# Patient Record
Sex: Female | Born: 1985 | Race: White | Hispanic: No | Marital: Married | Smoking: Former smoker
Health system: Southern US, Community
[De-identification: ages and names within clinical notes are randomized; demographics above are authoritative.]

## PROBLEM LIST (undated history)

## (undated) DIAGNOSIS — T790XXA Air embolism (traumatic), initial encounter: Secondary | ICD-10-CM

---

## 2010-08-25 DIAGNOSIS — T790XXA Air embolism (traumatic), initial encounter: Secondary | ICD-10-CM

## 2010-08-25 HISTORY — DX: Air embolism (traumatic), initial encounter: T79.0XXA

## 2016-08-21 ENCOUNTER — Emergency Department: Payer: Medicaid Other

## 2016-08-21 ENCOUNTER — Emergency Department
Admission: EM | Admit: 2016-08-21 | Discharge: 2016-08-21 | Disposition: A | Discharge status: Home / Self Care | Payer: Medicaid Other | Source: Home / Self Care | Attending: Emergency Medicine | Admitting: Emergency Medicine

## 2016-08-21 ENCOUNTER — Encounter: Payer: Self-pay | Admitting: Emergency Medicine

## 2016-08-21 ENCOUNTER — Observation Stay
Admit: 2016-08-21 | Discharge: 2016-08-21 | Disposition: A | Discharge status: Home / Self Care | Payer: Medicaid Other | Attending: Obstetrics and Gynecology | Admitting: Obstetrics and Gynecology

## 2016-08-21 DIAGNOSIS — R109 Unspecified abdominal pain: Secondary | ICD-10-CM | POA: Insufficient documentation

## 2016-08-21 DIAGNOSIS — O26893 Other specified pregnancy related conditions, third trimester: Secondary | ICD-10-CM

## 2016-08-21 DIAGNOSIS — S52125A Nondisplaced fracture of head of left radius, initial encounter for closed fracture: Secondary | ICD-10-CM

## 2016-08-21 DIAGNOSIS — Y939 Activity, unspecified: Secondary | ICD-10-CM

## 2016-08-21 DIAGNOSIS — Y999 Unspecified external cause status: Secondary | ICD-10-CM | POA: Insufficient documentation

## 2016-08-21 DIAGNOSIS — Z87891 Personal history of nicotine dependence: Secondary | ICD-10-CM

## 2016-08-21 DIAGNOSIS — O26899 Other specified pregnancy related conditions, unspecified trimester: Secondary | ICD-10-CM

## 2016-08-21 DIAGNOSIS — S42409A Unspecified fracture of lower end of unspecified humerus, initial encounter for closed fracture: Secondary | ICD-10-CM | POA: Diagnosis present

## 2016-08-21 DIAGNOSIS — S52122A Displaced fracture of head of left radius, initial encounter for closed fracture: Secondary | ICD-10-CM

## 2016-08-21 DIAGNOSIS — Z3A32 32 weeks gestation of pregnancy: Secondary | ICD-10-CM

## 2016-08-21 DIAGNOSIS — Y929 Unspecified place or not applicable: Secondary | ICD-10-CM | POA: Insufficient documentation

## 2016-08-21 DIAGNOSIS — Z3490 Encounter for supervision of normal pregnancy, unspecified, unspecified trimester: Secondary | ICD-10-CM

## 2016-08-21 DIAGNOSIS — R1031 Right lower quadrant pain: Secondary | ICD-10-CM

## 2016-08-21 DIAGNOSIS — W19XXXA Unspecified fall, initial encounter: Secondary | ICD-10-CM | POA: Diagnosis not present

## 2016-08-21 DIAGNOSIS — W1830XA Fall on same level, unspecified, initial encounter: Secondary | ICD-10-CM | POA: Diagnosis present

## 2016-08-21 DIAGNOSIS — O9989 Other specified diseases and conditions complicating pregnancy, childbirth and the puerperium: Secondary | ICD-10-CM

## 2016-08-21 DIAGNOSIS — W010XXA Fall on same level from slipping, tripping and stumbling without subsequent striking against object, initial encounter: Secondary | ICD-10-CM

## 2016-08-21 HISTORY — DX: Air embolism (traumatic), initial encounter: T79.0XXA

## 2016-08-21 MED ORDER — HYDROCODONE-ACETAMINOPHEN 5-325 MG PO TABS
1.0000 | ORAL_TABLET | Freq: Once | ORAL | Status: AC | PRN
  Administered 2016-08-21: 1 via ORAL
  Filled 2016-08-21: qty 1

## 2016-08-21 NOTE — ED Notes (Signed)
Patient c/o cramping in right back

## 2016-08-21 NOTE — ED Triage Notes (Signed)
Pt tripped and fell and is co left elbow pain. Is also co right sided abd cramping pt is [redacted] weeks pregnant, but does have fetal movement. Pt denies any injuries at this time.

## 2016-08-21 NOTE — ED Notes (Signed)
Reviewed d/c instructions, follow-up care, use of ice/elevation, and splint care with patient. Pt verbalized understanding

## 2016-08-21 NOTE — Discharge Planning (Signed)
Patient given discharge instructions with education on fetal kick counts and back and abdominal pain during pregnancy.  Education provided about returning to hospital with contractions, leaking of fluid, and decreased fetal movement.  Patient [facility] [place]  Patient verbalized understanding.

## 2016-08-21 NOTE — ED Provider Notes (Signed)
[facility] Emergency Department Provider Note  ____________________________________________  Time seen: Approximately 9:06 PM  I have reviewed the triage vital signs and the nursing notes.   HISTORY  Chief Complaint Fall    HPI Michelle Mckay is a 30 y.o. female who presents to emergency department complaining of left elbow pain and right abdominal pain. Patient states that she is32 weeks pregnant and had a mechanical fall. Patient states that she caught herself with her left elbow but did hit right side of her abdomen. Patient reports sharp right abdominal pain is still has fetal movement. Patient denies any ecchymosis to the abdominal wall. Patient states that she caught most of her weight with her left elbow. She is having sharp pain over the olecranon with radiation to the forearm and mid upper arm. Patient denies any wrist or shoulder pain. She did not hit her head or lose consciousness.  Patient is evaluated in the emergency department for her left elbow pain and will be sent upstairs to labor and delivery for assessment of abdominal pain/trauma in pregnancy.   No past medical history on file.  There are no active problems to display for this patient.   No past surgical history on file.  Prior to Admission medications   Not on File    Allergies Patient has no known allergies.  No family history on file.  Social History Social History  Substance Use Topics  . Smoking status: Not on file  . Smokeless tobacco: Not on file  . Alcohol use Not on file     Review of Systems  Constitutional: No fever/chills Eyes: No visual changes. Cardiovascular: no chest pain. Respiratory: no cough. No SOB. Gastrointestinal: Positive for right abdominal pain. Patient reports good fetal movement at this time.  No nausea, no vomiting.  No diarrhea.  No constipation. Genitourinary: No vaginal bleeding or discharge Musculoskeletal: Positive for sharp left  elbow pain Skin: Negative for rash, abrasions, lacerations, ecchymosis. Neurological: Negative for headaches, focal weakness or numbness. 10-point ROS otherwise negative.  ____________________________________________   PHYSICAL EXAM:  VITAL SIGNS: ED Triage Vitals  Enc Vitals Group     BP 08/21/16 1954 (!) 141/85     Pulse Rate 08/21/16 1954 97     Resp 08/21/16 1954 18     Temp 08/21/16 1954 98.2 F (36.8 C)     Temp Source 08/21/16 1954 Oral     SpO2 08/21/16 1954 99 %     Weight 08/21/16 1954 232 lb (105.2 kg)     Height 08/21/16 1954 5\' 3"  (1.6 m)     Head Circumference --      Peak Flow --      Pain Score 08/21/16 1955 5     Pain Loc --      Pain Edu? --      Excl. in GC? --      Constitutional: Alert and oriented. Well appearing and in no acute distress. Eyes: Conjunctivae are normal. PERRL. EOMI. Head: Atraumatic. Neck: No stridor.    Cardiovascular: Normal rate, regular rhythm. Normal S1 and S2.  Good peripheral circulation. Respiratory: Normal respiratory effort without tachypnea or retractions. Lungs CTAB. Good air entry to the bases with no decreased or absent breath sounds. Gastrointestinal: No visible ecchymosis, abrasions, lacerations noted to the abdominal wall. Bowel sounds 4 quadrants. Gravid abdomen. Soft to palpation. Patient is tender in the right lower quadrant.. No guarding or rigidity. No palpable masses. No distention Musculoskeletal: Limited range of motion to the  left elbow due to pain. Patient is exquisitely tender to palpation over the olecranon process and medial epicondyle. No palpable abnormality. Examination of the shoulder and wrist are unremarkable. Radial pulse intact distally. Sensation intact 5 digits. Neurologic:  Normal speech and language. No gross focal neurologic deficits are appreciated.  Skin:  Skin is warm, dry and intact. No rash noted. Psychiatric: Mood and affect are normal. Speech and behavior are normal. Patient exhibits  appropriate insight and judgement.   ____________________________________________   LABS (all labs ordered are listed, but only abnormal results are displayed)  Labs Reviewed - No data to display ____________________________________________  EKG   ____________________________________________  RADIOLOGY Festus BarrenI, Jonathan D Cuthriell, personally viewed and evaluated these images (plain radiographs) as part of my medical decision making, as well as reviewing the written report by the radiologist.  Dg Elbow Complete Left  Result Date: 08/21/2016 CLINICAL DATA:  Fall today onto left elbow all, not with elbow pain and limited range of motion. Patient in third trimester pregnancy. EXAM: LEFT ELBOW - COMPLETE 3+ VIEW COMPARISON:  None. FINDINGS: Minimally displaced fracture of the radial head extending to the radiocapitellar joint. Articular surface distraction of at least 3 mm with minimal depression. There is an associated joint effusion. No associated dislocation. Distal humerus and proximal ulna are intact. IMPRESSION: Minimally displaced intra-articular radial head fracture with associated joint effusion. Electronically Signed   By: Rubye OaksMelanie  Ehinger M.D.   On: 08/21/2016 21:11    ____________________________________________    PROCEDURES  Procedure(s) performed:    .Splint Application Date/Time: 08/21/2016 11:43 PM Performed by: Gala RomneyUTHRIELL, JONATHAN D Authorized by: Gala RomneyUTHRIELL, JONATHAN D   Consent:    Consent obtained:  Verbal   Consent given by:  Patient   Risks discussed:  Pain Pre-procedure details:    Sensation:  Normal Procedure details:    Laterality:  Left   Location:  Elbow   Elbow:  L elbow   Splint type:  Long arm   Supplies:  Cotton padding, Ortho-Glass, elastic bandage and sling Post-procedure details:    Pain:  Improved   Sensation:  Normal   Patient tolerance of procedure:  Tolerated well, no immediate complications Comments:     Posterior OCL long arm  splint is applied. Patient is given sling for support.      Medications - No data to display   ____________________________________________   INITIAL IMPRESSION / ASSESSMENT AND PLAN / ED COURSE  Pertinent labs & imaging results that were available during my care of the patient were reviewed by me and considered in my medical decision making (see chart for details).  Review of the [place] CSRS was performed in accordance of the [facility] prior to dispensing any controlled drugs.  Clinical Course     Patient's diagnosis is consistent with Left radial head fracture.. Patient is [redacted] weeks pregnant. Discussed risks versus benefits of x-ray for injury. This time, patient will be shielded with lead apron and x-rays of the left elbow will be undertaken. This reveals the above diagnoses. Elbow was splinted and placed in a sling.. Vision is pregnant so she may take Tylenol for pain at home. At this time, no investigation into abdominal pain is undertaken as patient will go up to labor and delivery and be evaluated by OB/GYN.  Upon discharge, patient will be transported upstairs to labor and delivery unit for evaluation of abdominal trauma in pregnancy. At this time, no imaging, labs are undertaken as this will be managed by LDR.    ____________________________________________  FINAL CLINICAL IMPRESSION(S) / ED DIAGNOSES  Final diagnoses:  Closed nondisplaced fracture of head of left radius, initial encounter  Fall, initial encounter  [redacted] weeks gestation of pregnancy  Abdominal pain during intrauterine pregnancy      NEW MEDICATIONS STARTED DURING THIS VISIT:  There are no discharge medications for this patient.       This chart was dictated using voice recognition software/Dragon. Despite best efforts to proofread, errors can occur which can change the meaning. Any change was purely unintentional.    Racheal Patches, PA-C 08/21/16 [id number]    Delorise Royals Cuthriell, PA-C 08/21/16  [id number]    Charlynne Pander, MD 08/22/16 [phone]

## 2016-08-21 NOTE — ED Notes (Signed)
Patient reports she feels fetal movements.

## 2016-08-21 NOTE — OB Triage Note (Signed)
Patient arrived in triage from ED after being cleared medically. Patient arrived to ED for fall at approx 1915. Patient fell on left arm but hit right side of abdomen. Diagnosed with a broken left elbow in ED. States she is having "sharp/shooting" pain on right side of abdomen and lower back when breathing in. States it does not feel like contractions. Positive fetal movement noted. Denies leaking of fluid or vaginal bleeding. EFM applied. Discussed plan of care. Patient verbalized understanding.

## 2016-08-21 NOTE — ED Notes (Signed)
Patient reports she fell at approx 1915 today on left elbow and right abdomen. Patient is [redacted] weeks pregnant.

## 2016-08-23 DIAGNOSIS — S42409A Unspecified fracture of lower end of unspecified humerus, initial encounter for closed fracture: Secondary | ICD-10-CM | POA: Diagnosis present

## 2016-08-23 DIAGNOSIS — O26893 Other specified pregnancy related conditions, third trimester: Secondary | ICD-10-CM | POA: Diagnosis present

## 2016-08-23 DIAGNOSIS — W1830XA Fall on same level, unspecified, initial encounter: Secondary | ICD-10-CM | POA: Diagnosis present

## 2016-08-23 DIAGNOSIS — R109 Unspecified abdominal pain: Secondary | ICD-10-CM

## 2016-08-23 NOTE — Final Progress Note (Signed)
L&D OB Triage Note  Michelle Mckay is a 30 y.o. 848P7 unassigned female at 7010w3d, EDD Estimated Date of Delivery: 10/13/16 who presented to triage after being evaluated in Emergency Room for s/p fall (ground level).  Patient fell on left arm but hit right side of the abdomen.  Was diagnosed with a broken left elbow in the ED.  Is curently noting sharp/shooting pain on right side of abdomen and lower ack when breathing in.  Denies contractions, LOF, or vaginal bleeding. She was evaluated by the nurses with no significant findings for contractions or evidence of abruption. Vital signs stable. An NST was performed and has been reviewed by MD. She was treated with Percocet for pain.   NST INTERPRETATION: Indications: rule out uterine contractions and s/p fall  Mode: External (EFM d/c'd for patient discharge home) Baseline Rate (A): 145 bpm Variability: Moderate Accelerations: 15 x 15 Decelerations: None     Contraction Frequency (min): 0  Impression: reactive   Plan: NST performed was reviewed and was found to be reactive. She was discharged home with bleeding/ preterm labor precautions.  Continue routine prenatal care. Follow up with OB/GYN as previously scheduled.     Hildred LaserAnika Kauri Garson, MD  [facility]

## 2017-06-26 ENCOUNTER — Encounter (HOSPITAL_COMMUNITY): Payer: Self-pay

## 2018-03-11 IMAGING — DX DG ELBOW COMPLETE 3+V*L*
4 series · 4 of 4 positions shown · non-contrast
Comparison: None.

CLINICAL DATA: Fall today onto left elbow all, not with elbow pain
and limited range of motion. Patient in third trimester pregnancy.

EXAM:
LEFT ELBOW - COMPLETE 3+ VIEW

[elbow ap]
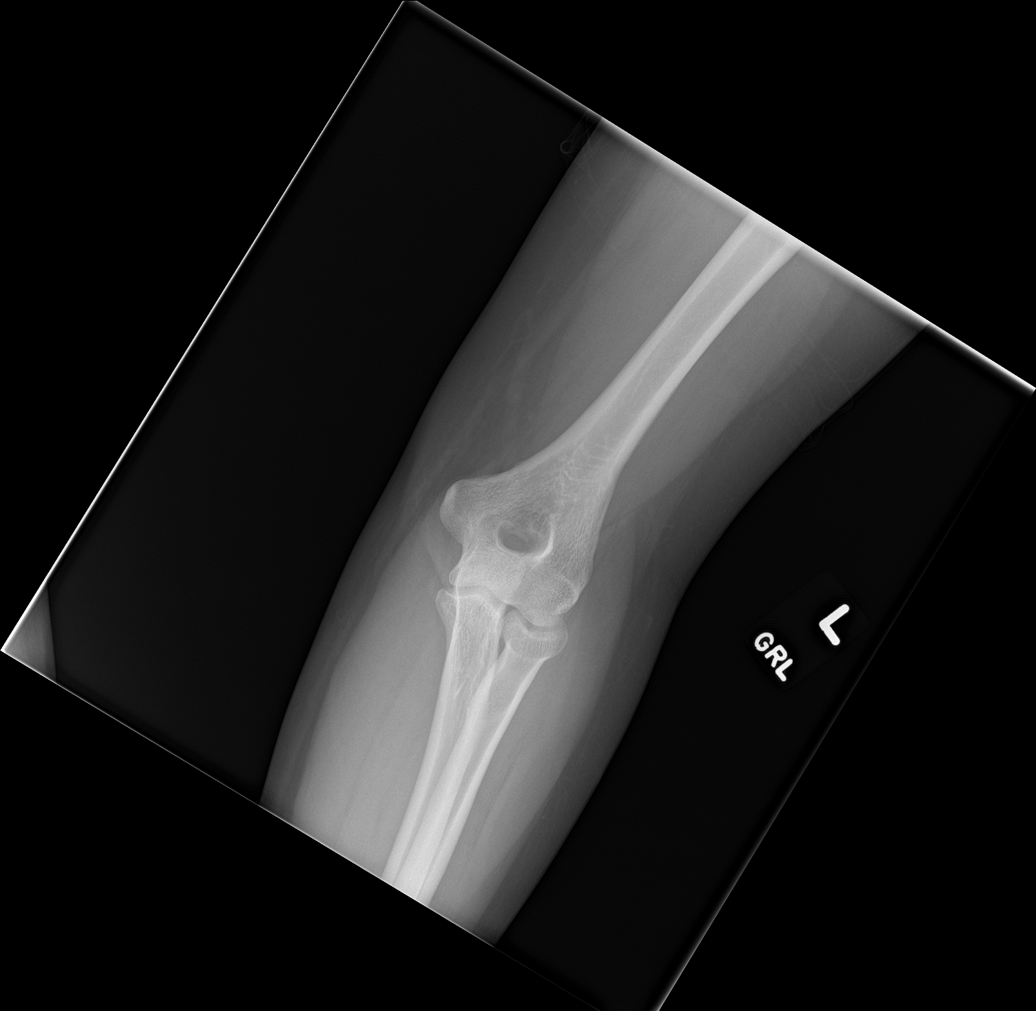

[elbow obl (1 of 2)]
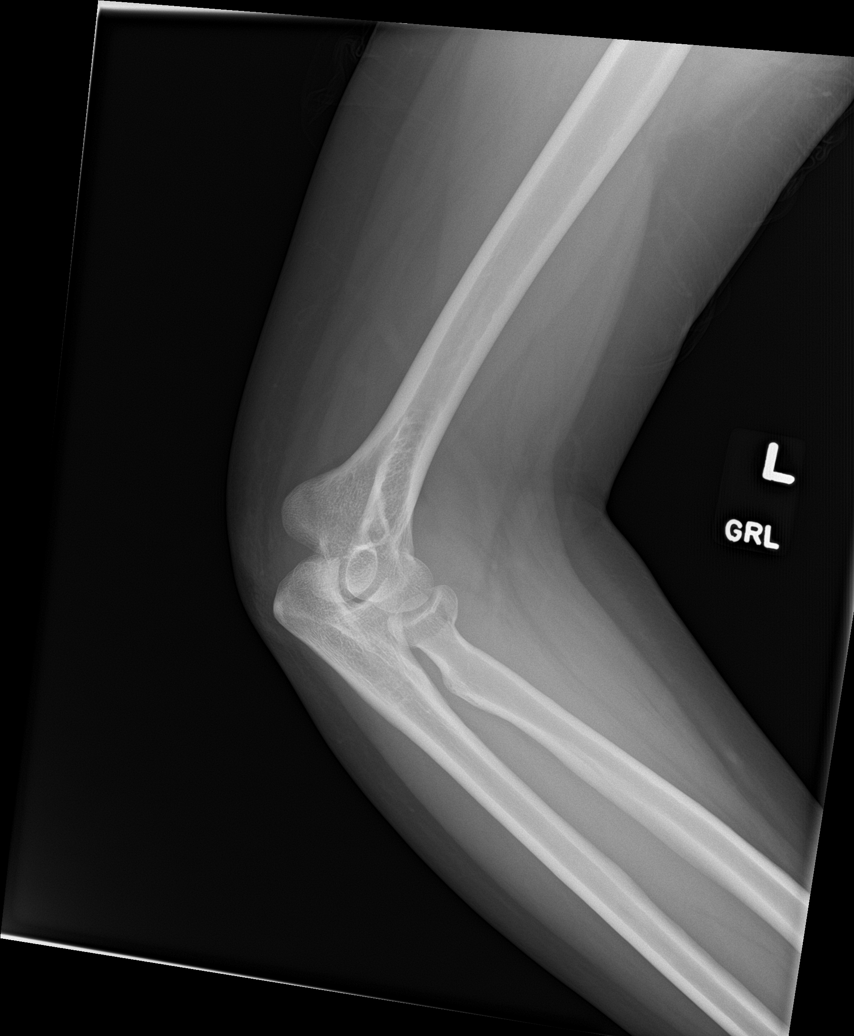

[elbow obl (2 of 2)]
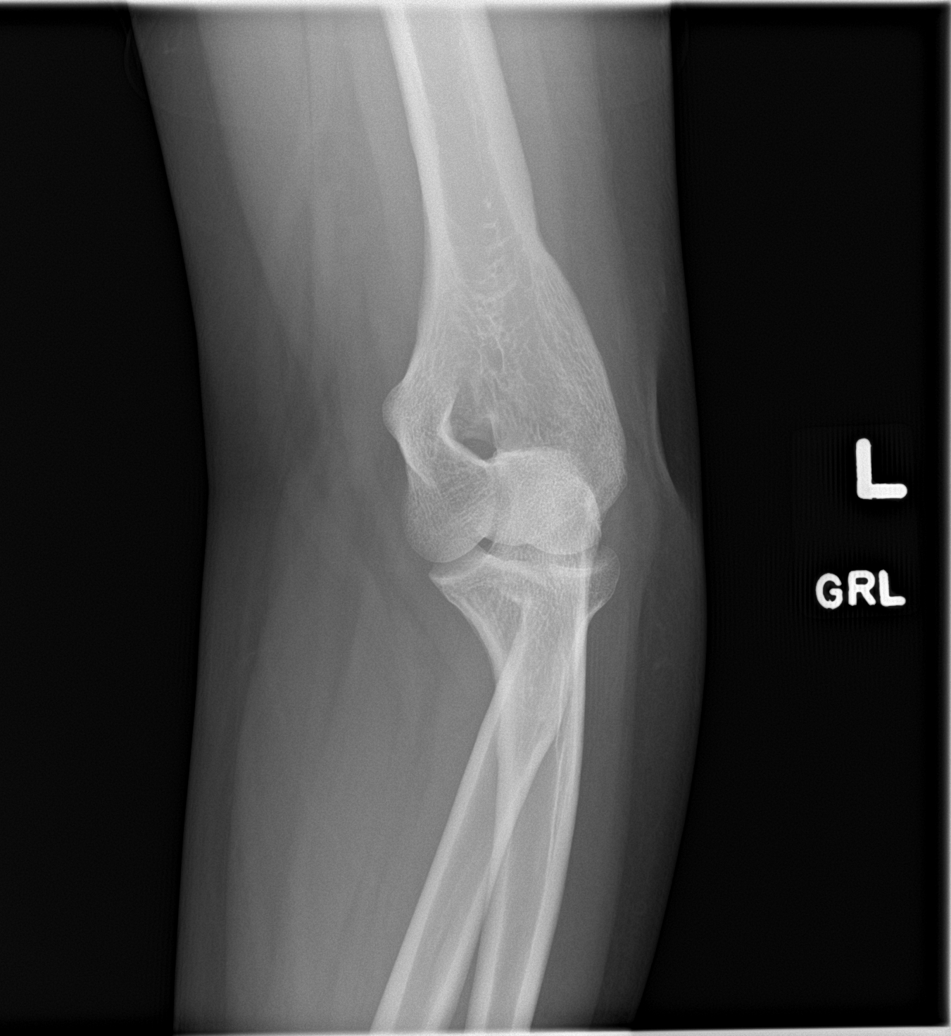

[elbow lat]
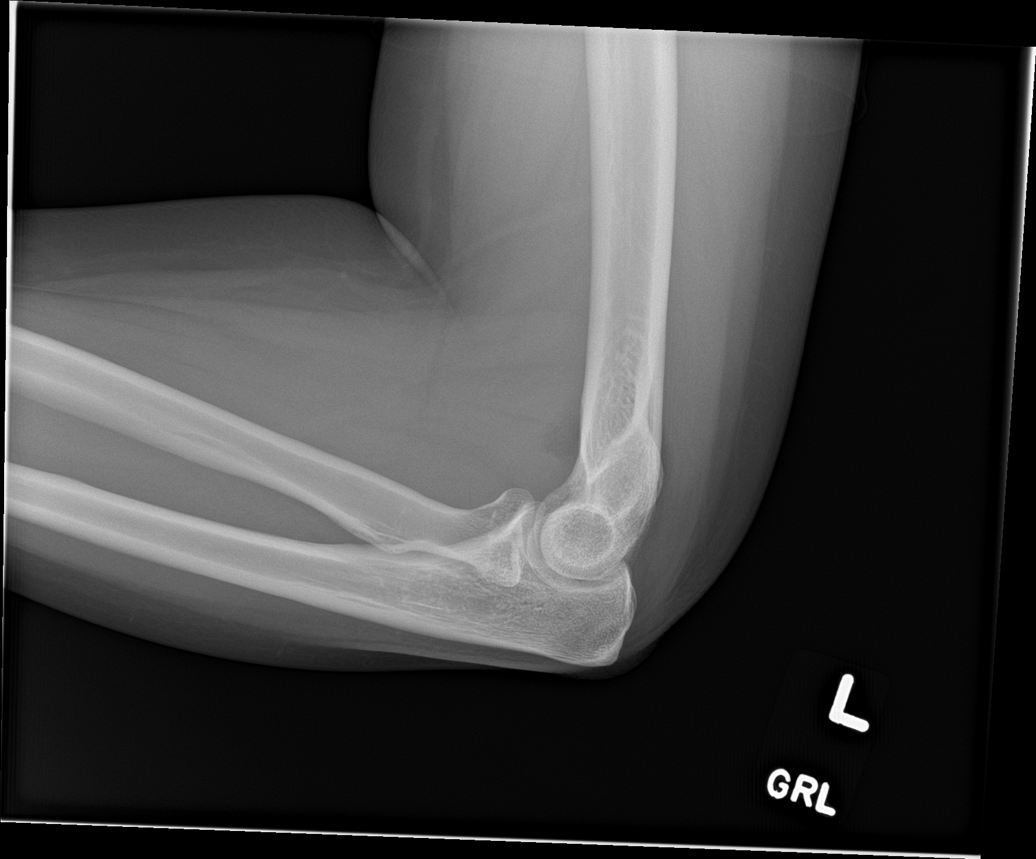

[4 of 4 positions shown; findings below may reference images not displayed]

FINDINGS: Minimally displaced fracture of the radial head extending to the
radiocapitellar joint. Articular surface distraction of at least 3
mm with minimal depression. There is an associated joint effusion.
No associated dislocation. Distal humerus and proximal ulna are
intact.
IMPRESSION: Minimally displaced intra-articular radial head fracture with
associated joint effusion.
